# Patient Record
Sex: Female | Born: 1955 | Race: White | Hispanic: No | Marital: Married | Smoking: Never smoker
Health system: Southern US, Community
[De-identification: ages and names within clinical notes are randomized; demographics above are authoritative.]

## PROBLEM LIST (undated history)

## (undated) DIAGNOSIS — M1990 Unspecified osteoarthritis, unspecified site: Secondary | ICD-10-CM

## (undated) DIAGNOSIS — E119 Type 2 diabetes mellitus without complications: Secondary | ICD-10-CM

## (undated) HISTORY — PX: BREAST LUMPECTOMY: SHX2

## (undated) HISTORY — PX: BREAST BIOPSY: SHX20

---

## 2017-01-30 DIAGNOSIS — H59812 Chorioretinal scars after surgery for detachment, left eye: Secondary | ICD-10-CM | POA: Insufficient documentation

## 2017-01-30 DIAGNOSIS — H35411 Lattice degeneration of retina, right eye: Secondary | ICD-10-CM | POA: Insufficient documentation

## 2022-02-05 DIAGNOSIS — E119 Type 2 diabetes mellitus without complications: Secondary | ICD-10-CM | POA: Insufficient documentation

## 2022-02-05 DIAGNOSIS — I10 Essential (primary) hypertension: Secondary | ICD-10-CM | POA: Insufficient documentation

## 2022-03-08 DIAGNOSIS — M545 Low back pain, unspecified: Secondary | ICD-10-CM | POA: Insufficient documentation

## 2022-03-08 DIAGNOSIS — M2569 Stiffness of other specified joint, not elsewhere classified: Secondary | ICD-10-CM | POA: Insufficient documentation

## 2022-03-08 DIAGNOSIS — G8929 Other chronic pain: Secondary | ICD-10-CM | POA: Insufficient documentation

## 2022-04-16 ENCOUNTER — Inpatient Hospital Stay
Admission: RE | Admit: 2022-04-16 | Discharge: 2022-04-16 | Disposition: A | Discharge status: Home / Self Care | Payer: Self-pay | Source: Ambulatory Visit | Attending: *Deleted | Admitting: *Deleted

## 2022-04-16 ENCOUNTER — Other Ambulatory Visit: Payer: Self-pay | Admitting: *Deleted

## 2022-04-16 DIAGNOSIS — Z1231 Encounter for screening mammogram for malignant neoplasm of breast: Secondary | ICD-10-CM

## 2022-04-18 ENCOUNTER — Other Ambulatory Visit: Payer: Self-pay | Admitting: Infectious Diseases

## 2022-04-18 DIAGNOSIS — Z1231 Encounter for screening mammogram for malignant neoplasm of breast: Secondary | ICD-10-CM

## 2022-05-22 ENCOUNTER — Ambulatory Visit
Admission: RE | Admit: 2022-05-22 | Discharge: 2022-05-22 | Disposition: A | Discharge status: Home / Self Care | Payer: Medicare Other | Source: Ambulatory Visit | Attending: Infectious Diseases | Admitting: Infectious Diseases

## 2022-05-22 DIAGNOSIS — Z1231 Encounter for screening mammogram for malignant neoplasm of breast: Secondary | ICD-10-CM | POA: Diagnosis present

## 2022-08-14 ENCOUNTER — Ambulatory Visit (INDEPENDENT_AMBULATORY_CARE_PROVIDER_SITE_OTHER): Payer: Medicare Other

## 2022-08-14 ENCOUNTER — Ambulatory Visit: Payer: Medicare Other | Admitting: Podiatry

## 2022-08-14 DIAGNOSIS — M7661 Achilles tendinitis, right leg: Secondary | ICD-10-CM | POA: Diagnosis not present

## 2022-08-14 DIAGNOSIS — M79671 Pain in right foot: Secondary | ICD-10-CM

## 2022-08-21 ENCOUNTER — Encounter: Payer: Self-pay | Admitting: Podiatry

## 2022-08-21 NOTE — Progress Notes (Signed)
Subjective:  Patient ID: Kayla Mcconnell, female    DOB: 01-29-1956,  MRN: [id number]  Chief Complaint  Patient presents with   Foot Pain    Right heel pain    66 y.o. female presents with the above complaint.  Patient presents with complaint of right Achilles insertional pain.  Patient has been on for quite some time is progressive gotten worse.  She states she was doing in the past with cam boot immobilization has not helped.  She would like to discuss other treatment options that are available.  She denies seeing anyone else prior to seeing me she denies any other acute complaints.  Pain scale 7 out of 10 hurts with ambulation hurts with pressure   Review of Systems: Negative except as noted in the HPI. Denies N/V/F/Ch.  No past medical history on file.  Current Outpatient Medications:    atorvastatin (LIPITOR) 10 MG tablet, Take by mouth., Disp: , Rfl:    empagliflozin (JARDIANCE) 10 MG TABS tablet, Take 1 tablet by mouth daily., Disp: , Rfl:    glipiZIDE (GLUCOTROL) 5 MG tablet, Take by mouth., Disp: , Rfl:    glyBURIDE (DIABETA) 5 MG tablet, , Disp: , Rfl:    metFORMIN (GLUCOPHAGE) 1000 MG tablet, , Disp: , Rfl:    ondansetron (ZOFRAN-ODT) 4 MG disintegrating tablet, Take by mouth., Disp: , Rfl:    oxybutynin (DITROPAN-XL) 5 MG 24 hr tablet, Take by mouth., Disp: , Rfl:    amoxicillin-clavulanate (AUGMENTIN) 500-125 MG tablet, SMARTSIG:1 Tablet(s) By Mouth Every 12 Hours, Disp: , Rfl:    estradiol (ESTRACE) 0.1 MG/GM vaginal cream, Place 1 g vaginally at bedtime., Disp: , Rfl:    fluorouracil (EFUDEX) 5 % cream, Apply topically 2 (two) times daily., Disp: , Rfl:    lisinopril (ZESTRIL) 5 MG tablet, Take by mouth., Disp: , Rfl:    methylPREDNISolone (MEDROL DOSEPAK) 4 MG TBPK tablet, Take by mouth as directed., Disp: , Rfl:    MYRBETRIQ 25 MG TB24 tablet, Take 25 mg by mouth daily., Disp: , Rfl:    nitrofurantoin, macrocrystal-monohydrate, (MACROBID) 100 MG capsule, SMARTSIG:1  Capsule(s) By Mouth, Disp: , Rfl:    phenazopyridine (PYRIDIUM) 95 MG tablet, Take by mouth., Disp: , Rfl:    prednisoLONE acetate (PRED FORTE) 1 % ophthalmic suspension, Apply to eye., Disp: , Rfl:    predniSONE (DELTASONE) 50 MG tablet, Take 50 mg by mouth every 3 (three) hours., Disp: , Rfl:   Social History   Tobacco Use  Smoking Status Not on file  Smokeless Tobacco Not on file    Not on File Objective:  There were no vitals filed for this visit. There is no height or weight on file to calculate BMI. Constitutional Well developed. Well nourished.  Vascular Dorsalis pedis pulses palpable bilaterally. Posterior tibial pulses palpable bilaterally. Capillary refill normal to all digits.  No cyanosis or clubbing noted. Pedal hair growth normal.  Neurologic Normal speech. Oriented to person, place, and time. Epicritic sensation to light touch grossly present bilaterally.  Dermatologic Nails well groomed and normal in appearance. No open wounds. No skin lesions.  Orthopedic: Pain on palpation of right Achilles insertional pain.  Pain with dorsiflexion of the ankle joint no pain with plantarflexion of the ankle joint.  No pain of the peroneal tendon, posterior tibial tendon, ATFL ligament.  No deep intra-articular ankle pain noted.  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 Assessment:   1. Achilles tendinitis, right leg    Plan:  Patient was evaluated and treated and all questions answered.  Right Achilles tendinitis with underlying Haglund's deformity and gastrocnemius equinus -All questions and concerns were discussed with the patient in extensive detail. -Given the amount of pain that she is experiencing  in the setting of failed cam boot immobilization I believe she will benefit from a steroid injection as well as heel lift.  I discussed this with the patient in extensive detail if there is no improvement we will plan on getting an MRI during next clinical visit. -A steroid injection was performed at Right Kager's fat pad using 1% plain Lidocaine and 10 mg of Kenalog. This was well tolerated. -Heel lifts were dispensed  No follow-ups on file.

## 2022-09-27 ENCOUNTER — Ambulatory Visit: Payer: Medicare Other | Admitting: Podiatry

## 2022-10-25 ENCOUNTER — Encounter: Payer: Self-pay | Admitting: Ophthalmology

## 2022-10-27 IMAGING — MG MM DIGITAL SCREENING BILAT W/ TOMO AND CAD
6 of 10 series · 6 of 30 positions shown · non-contrast
Comparison: Previous exam(s).

CLINICAL DATA: Screening.

EXAM:
DIGITAL SCREENING BILATERAL MAMMOGRAM WITH TOMOSYNTHESIS AND CAD
TECHNIQUE: Bilateral screening digital craniocaudal and mediolateral oblique
mammograms were obtained. Bilateral screening digital breast
tomosynthesis was performed. The images were evaluated with
computer-aided detection.

[R MLO synth-2D]
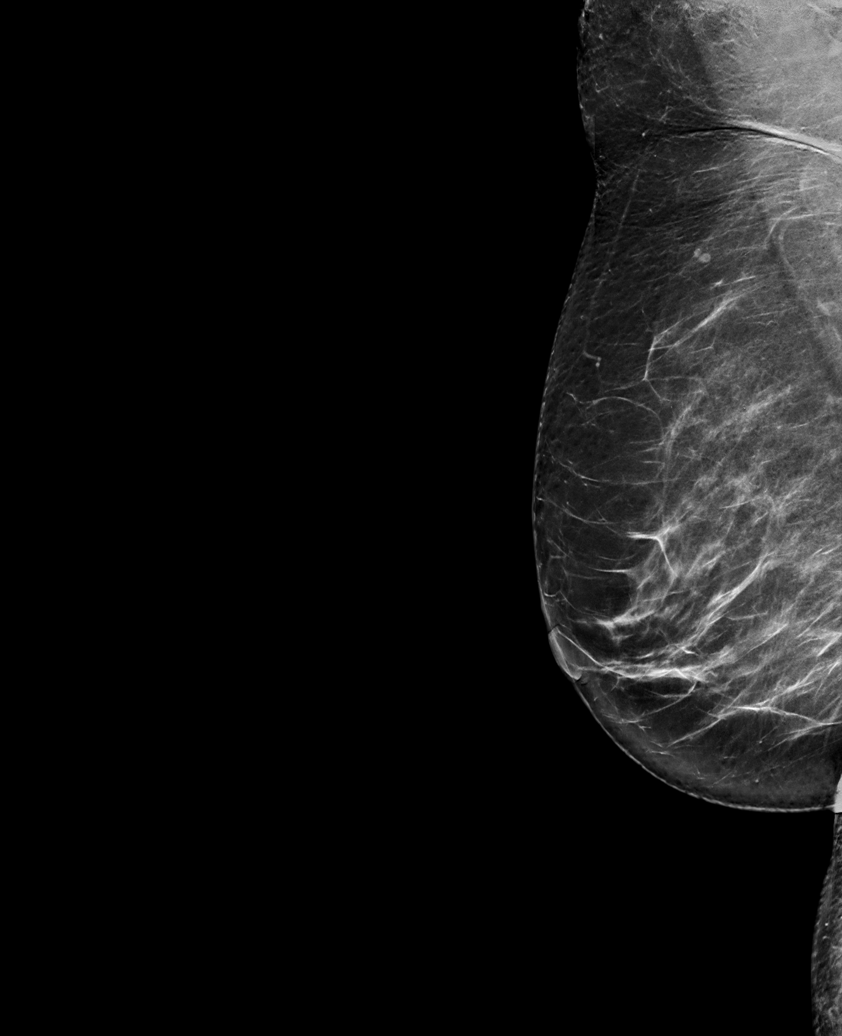

[L XCCL synth-2D]
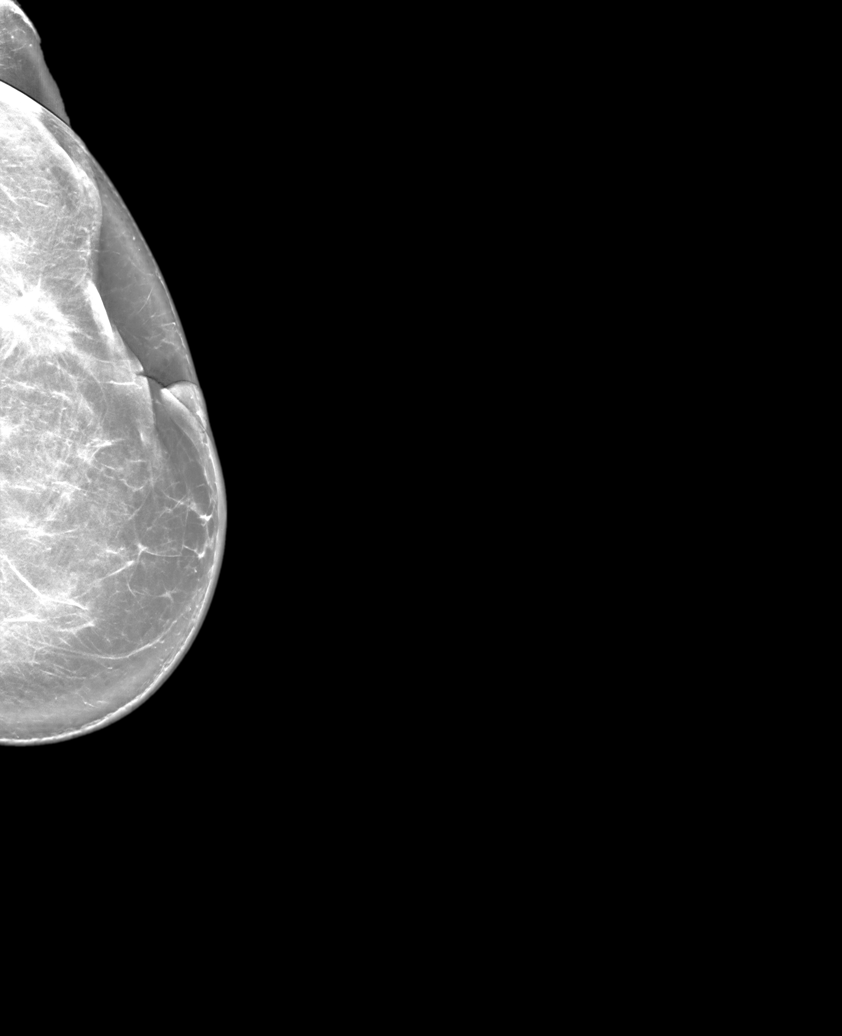

[L CC synth-2D]
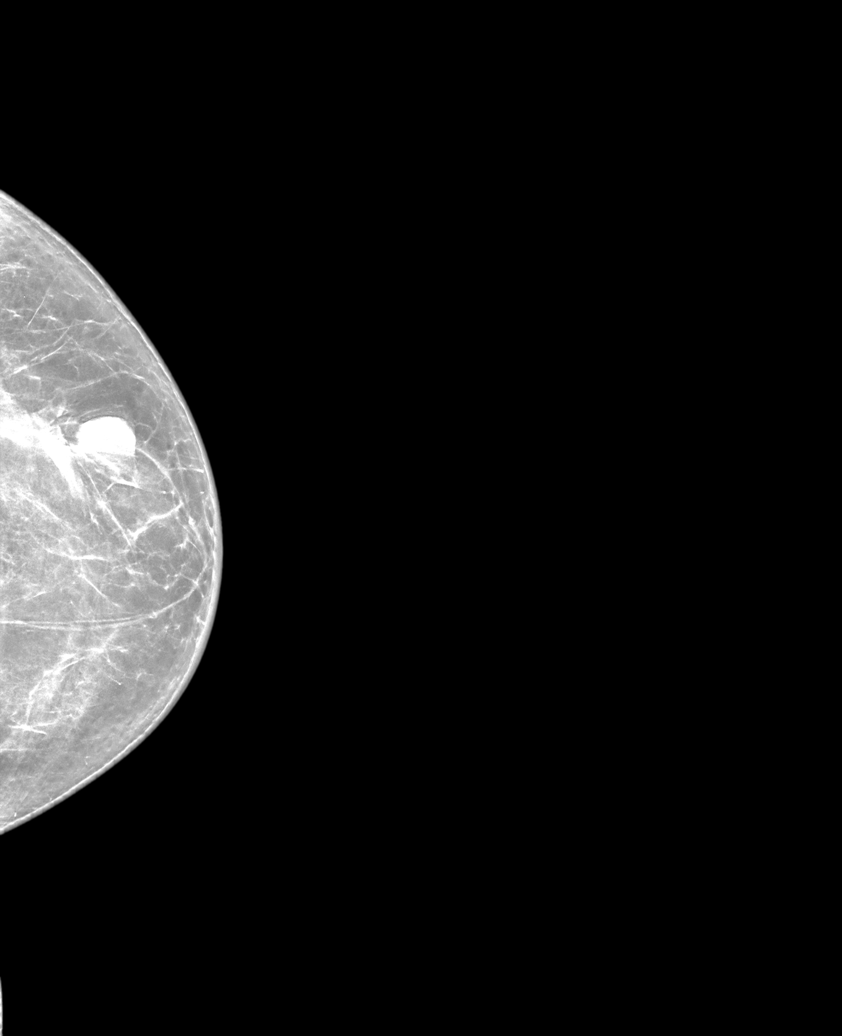

[R CC synth-2D]
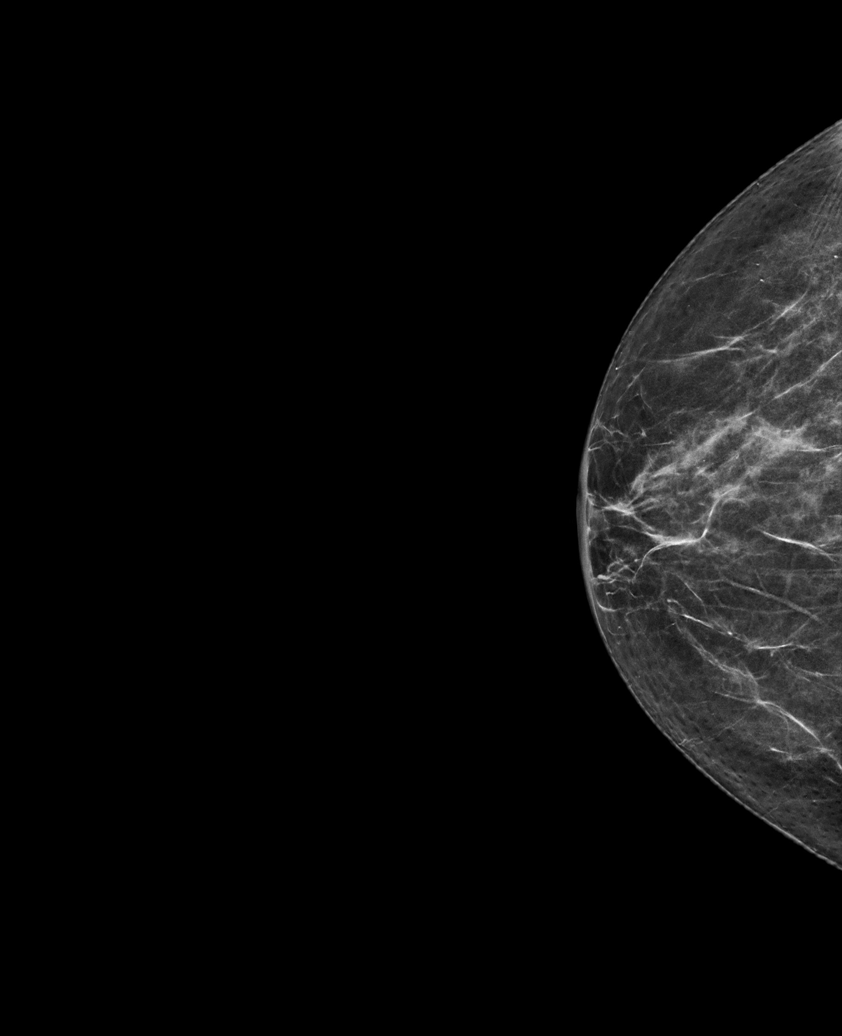

[L MLO synth-2D]
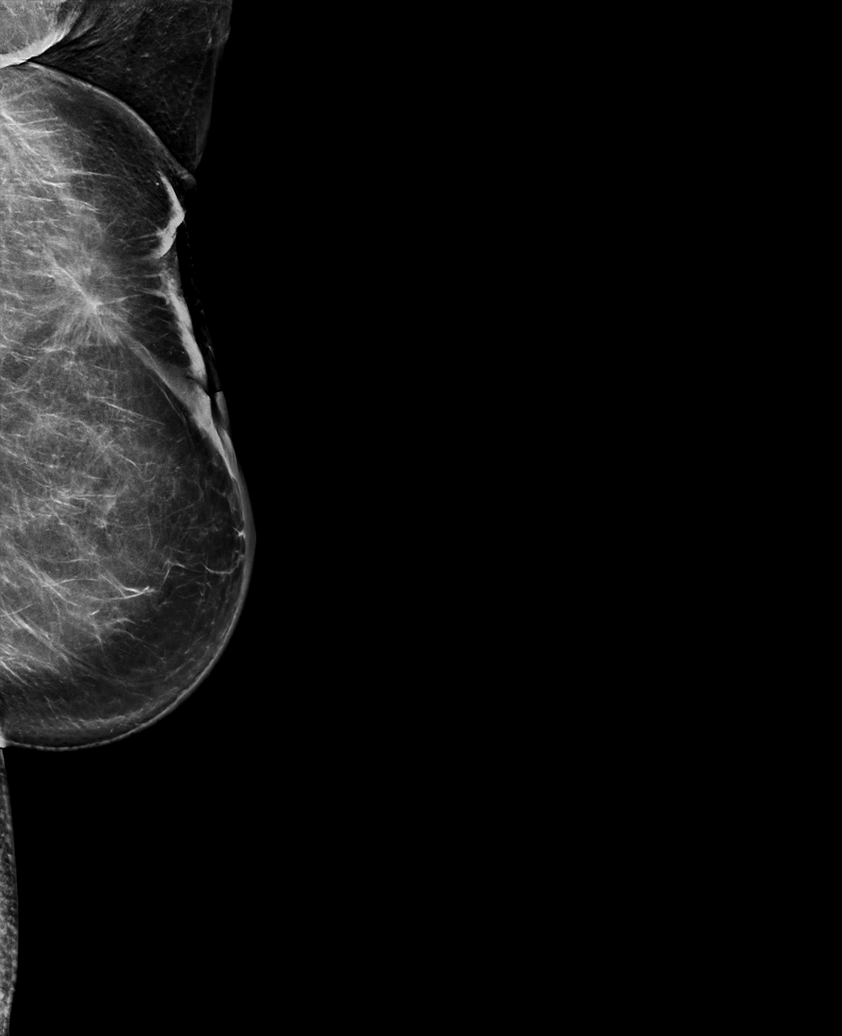

[R CC tomo · tomo slice 33/65.0]
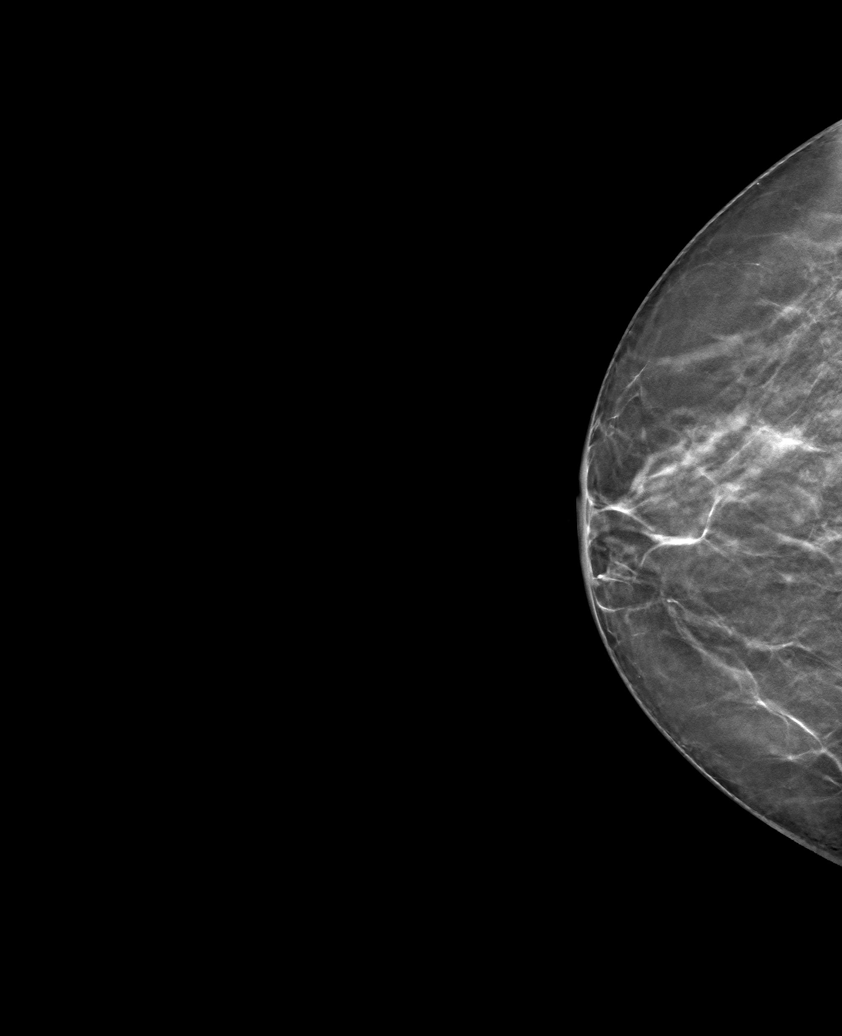

[6 of 30 positions shown; findings below may reference images not displayed]

ACR Breast Density Category b: There are scattered areas of
fibroglandular density.
FINDINGS: There are no findings suspicious for malignancy.
IMPRESSION: No mammographic evidence of malignancy. A result letter of this
screening mammogram will be mailed directly to the patient.

RECOMMENDATION:
Screening mammogram in one year. (Code:51-O-LD2)

BI-RADS CATEGORY  1: Negative.

## 2022-10-30 NOTE — Discharge Instructions (Signed)

## 2022-10-31 ENCOUNTER — Ambulatory Visit
Admission: RE | Admit: 2022-10-31 | Discharge: 2022-10-31 | Disposition: A | Discharge status: Home / Self Care | Payer: Medicare Other | Attending: Ophthalmology | Admitting: Ophthalmology

## 2022-10-31 ENCOUNTER — Encounter
Admission: RE | Disposition: A | Discharge status: Home / Self Care | Payer: Self-pay | Source: Home / Self Care | Attending: Ophthalmology

## 2022-10-31 ENCOUNTER — Encounter: Payer: Self-pay | Admitting: Ophthalmology

## 2022-10-31 ENCOUNTER — Ambulatory Visit: Payer: Medicare Other | Admitting: Anesthesiology

## 2022-10-31 ENCOUNTER — Other Ambulatory Visit: Payer: Self-pay

## 2022-10-31 DIAGNOSIS — H2511 Age-related nuclear cataract, right eye: Secondary | ICD-10-CM | POA: Diagnosis not present

## 2022-10-31 DIAGNOSIS — E1136 Type 2 diabetes mellitus with diabetic cataract: Secondary | ICD-10-CM | POA: Insufficient documentation

## 2022-10-31 HISTORY — DX: Type 2 diabetes mellitus without complications: E11.9

## 2022-10-31 HISTORY — PX: CATARACT EXTRACTION W/PHACO: SHX586

## 2022-10-31 HISTORY — DX: Unspecified osteoarthritis, unspecified site: M19.90

## 2022-10-31 LAB — GLUCOSE, CAPILLARY: Glucose-Capillary: 119 mg/dL — ABNORMAL HIGH (ref 70–99)

## 2022-10-31 SURGERY — PHACOEMULSIFICATION, CATARACT, WITH IOL INSERTION
Anesthesia: Monitor Anesthesia Care | Site: Eye | Laterality: Right

## 2022-10-31 MED ORDER — FENTANYL CITRATE (PF) 100 MCG/2ML IJ SOLN
INTRAMUSCULAR | Status: DC | PRN
  Administered 2022-10-31: 50 ug via INTRAVENOUS

## 2022-10-31 MED ORDER — SIGHTPATH DOSE#1 BSS IO SOLN
INTRAOCULAR | Status: DC | PRN
  Administered 2022-10-31: 15 mL

## 2022-10-31 MED ORDER — SIGHTPATH DOSE#1 BSS IO SOLN
INTRAOCULAR | Status: DC | PRN
  Administered 2022-10-31: 73 mL via OPHTHALMIC

## 2022-10-31 MED ORDER — SIGHTPATH DOSE#1 NA HYALUR & NA CHOND-NA HYALUR IO KIT
PACK | INTRAOCULAR | Status: DC | PRN
  Administered 2022-10-31: 1 via OPHTHALMIC

## 2022-10-31 MED ORDER — MOXIFLOXACIN HCL 0.5 % OP SOLN: OPHTHALMIC | Status: DC | PRN

## 2022-10-31 MED ORDER — TETRACAINE HCL 0.5 % OP SOLN
1.0000 [drp] | OPHTHALMIC | Status: DC | PRN
  Administered 2022-10-31 (×3): 1 [drp] via OPHTHALMIC

## 2022-10-31 MED ORDER — CEFUROXIME OPHTHALMIC INJECTION 1 MG/0.1 ML
INJECTION | OPHTHALMIC | Status: DC | PRN
  Administered 2022-10-31: 1 mg via OPHTHALMIC

## 2022-10-31 MED ORDER — ARMC OPHTHALMIC DILATING DROPS
1.0000 | OPHTHALMIC | Status: DC | PRN
  Administered 2022-10-31 (×3): 1 via OPHTHALMIC

## 2022-10-31 MED ORDER — BRIMONIDINE TARTRATE-TIMOLOL 0.2-0.5 % OP SOLN
OPHTHALMIC | Status: DC | PRN
  Administered 2022-10-31: 1 [drp] via OPHTHALMIC

## 2022-10-31 MED ORDER — SIGHTPATH DOSE#1 BSS IO SOLN
INTRAOCULAR | Status: DC | PRN
  Administered 2022-10-31: 1 mL via INTRAMUSCULAR

## 2022-10-31 MED ORDER — MIDAZOLAM HCL 2 MG/2ML IJ SOLN
INTRAMUSCULAR | Status: DC | PRN
  Administered 2022-10-31: 2 mg via INTRAVENOUS

## 2022-10-31 SURGICAL SUPPLY — 20 items
CANNULA ANT/CHMB 27G (MISCELLANEOUS) IMPLANT
CANNULA ANT/CHMB 27GA (MISCELLANEOUS) IMPLANT
CATARACT SUITE SIGHTPATH (MISCELLANEOUS) ×1 IMPLANT
FEE CATARACT SUITE SIGHTPATH (MISCELLANEOUS) ×1 IMPLANT
GLOVE SRG 8 PF TXTR STRL LF DI (GLOVE) ×1 IMPLANT
GLOVE SURG ENC TEXT LTX SZ7.5 (GLOVE) ×1 IMPLANT
GLOVE SURG GAMMEX PI TX LF 7.5 (GLOVE) IMPLANT
GLOVE SURG UNDER POLY LF SZ8 (GLOVE) ×1
LENS IOL TECNIS EYHANCE 19.0 (Intraocular Lens) IMPLANT
NDL FILTER BLUNT 18X1 1/2 (NEEDLE) ×1 IMPLANT
NDL RETROBULBAR .5 NSTRL (NEEDLE) IMPLANT
NEEDLE FILTER BLUNT 18X1 1/2 (NEEDLE) ×1 IMPLANT
PACK VIT ANT 23G (MISCELLANEOUS) IMPLANT
RING MALYGIN 7.0 (MISCELLANEOUS) IMPLANT
SUT ETHILON 10-0 CS-B-6CS-B-6 (SUTURE)
SUT VICRYL  9 0 (SUTURE)
SUT VICRYL 9 0 (SUTURE) IMPLANT
SUTURE EHLN 10-0 CS-B-6CS-B-6 (SUTURE) IMPLANT
SYR 3ML LL SCALE MARK (SYRINGE) ×1 IMPLANT
WATER STERILE IRR 250ML POUR (IV SOLUTION) ×1 IMPLANT

## 2022-10-31 NOTE — Transfer of Care (Signed)
Immediate Anesthesia Transfer of Care Note  Patient: Kayla Mcconnell  Procedure(s) Performed: CATARACT EXTRACTION PHACO AND INTRAOCULAR LENS PLACEMENT (IOC) RIGHT DIABETIC (Right: Eye)  Patient Location: PACU  Anesthesia Type: MAC  Level of Consciousness: awake, alert  and patient cooperative  Airway and Oxygen Therapy: Patient Spontanous Breathing and Patient connected to supplemental oxygen  Post-op Assessment: Post-op Vital signs reviewed, Patient's Cardiovascular Status Stable, Respiratory Function Stable, Patent Airway and No signs of Nausea or vomiting  Post-op Vital Signs: Reviewed and stable  Complications: No notable events documented.

## 2022-10-31 NOTE — Op Note (Signed)
LOCATION:  [facility]   PREOPERATIVE DIAGNOSIS:    Nuclear sclerotic cataract right eye. H25.11   POSTOPERATIVE DIAGNOSIS:  Nuclear sclerotic cataract right eye.     PROCEDURE:  Phacoemusification with posterior chamber intraocular lens placement of the right eye   ULTRASOUND TIME: Procedure(s) with comments: CATARACT EXTRACTION PHACO AND INTRAOCULAR LENS PLACEMENT (IOC) RIGHT DIABETIC (Right) - Diabetic 10.53 01:22.5  LENS:   Implant Name Type Inv. Item Serial No. Manufacturer Lot No. LRB No. Used Action  LENS IOL TECNIS EYHANCE 19.0 - [id number] Intraocular Lens LENS IOL TECNIS EYHANCE 19.0 [id number] [employer]  Right 1 Implanted         SURGEON:  Deirdre Evener, MD   ANESTHESIA:  Topical with tetracaine drops and 2% Xylocaine jelly, augmented with 1% preservative-free intracameral lidocaine.    COMPLICATIONS:  None.   DESCRIPTION OF PROCEDURE:  The patient was identified in the holding room and transported to the operating room and placed in the supine position under the operating microscope.  The right eye was identified as the operative eye and it was prepped and draped in the usual sterile ophthalmic fashion.   A 1 millimeter clear-corneal paracentesis was made at the 12:00 position.  0.5 ml of preservative-free 1% lidocaine was injected into the anterior chamber. The anterior chamber was filled with Viscoat viscoelastic.  A 2.4 millimeter keratome was used to make a near-clear corneal incision at the 9:00 position.  A curvilinear capsulorrhexis was made with a cystotome and capsulorrhexis forceps.  Balanced salt solution was used to hydrodissect and hydrodelineate the nucleus.   Phacoemulsification was then used in stop and chop fashion to remove the lens nucleus and epinucleus.  The remaining cortex was then removed using the irrigation and aspiration handpiece. Provisc was then placed into the capsular bag to distend it for lens placement.  A lens was then  injected into the capsular bag.  The remaining viscoelastic was aspirated.   Wounds were hydrated with balanced salt solution.  The anterior chamber was inflated to a physiologic pressure with balanced salt solution.  No wound leaks were noted. Cefuroxime 0.1 ml of a 10mg /ml solution was injected into the anterior chamber for a dose of 1 mg of intracameral antibiotic at the completion of the case.   Timolol and Brimonidine drops were applied to the eye.  The patient was taken to the recovery room in stable condition without complications of anesthesia or surgery.   Donnita Farina 10/31/2022, 7:54 AM

## 2022-10-31 NOTE — H&P (Signed)
  [facility]   Primary Care Physician:  Mick Sell, MD Ophthalmologist: Dr. Lockie Mola  Pre-Procedure History & Physical: HPI:  Kayla Mcconnell is a 66 y.o. female here for ophthalmic surgery.   Past Medical History:  Diagnosis Date   Arthritis    lower back, knees   Type 2 diabetes mellitus (HCC)     Past Surgical History:  Procedure Laterality Date   BREAST BIOPSY     BREAST LUMPECTOMY      Prior to Admission medications   Medication Sig Start Date End Date Taking? Authorizing Provider  fluorouracil (EFUDEX) 5 % cream Apply topically 2 (two) times daily.   Yes [provider]  glipiZIDE (GLUCOTROL) 5 MG tablet Take by mouth. 06/01/20  Yes [provider]  metFORMIN (GLUCOPHAGE) 1000 MG tablet  06/01/20  Yes [provider]  MYRBETRIQ 25 MG TB24 tablet Take 25 mg by mouth daily. 06/01/22  Yes [provider]  nitrofurantoin, macrocrystal-monohydrate, (MACROBID) 100 MG capsule SMARTSIG:1 Capsule(s) By Mouth 08/03/22  Yes [provider]  prednisoLONE acetate (PRED FORTE) 1 % ophthalmic suspension Apply to eye.   Yes [provider]    Allergies as of 09/28/2022   (Not on File)    History reviewed. No pertinent family history.  Social History   Socioeconomic History   Marital status: Married    Spouse name: Not on file   Number of children: Not on file   Years of education: Not on file   Highest education level: Not on file  Occupational History   Not on file  Tobacco Use   Smoking status: Never   Smokeless tobacco: Never  Vaping Use   Vaping Use: Never used  Substance and Sexual Activity   Alcohol use: Yes    Comment: occasional   Drug use: Not on file   Sexual activity: Not on file  Other Topics Concern   Not on file  Social History Narrative   Not on file   Social Determinants of Health   Financial Resource Strain: Not on file  Food Insecurity: Not on file  Transportation  Needs: Not on file  Physical Activity: Not on file  Stress: Not on file  Social Connections: Not on file  Intimate Partner Violence: Not on file    Review of Systems: See HPI, otherwise negative ROS  Physical Exam: BP 138/77   Pulse 70   Temp 98 F (36.7 C) (Temporal)   Ht 5\' 9"  (1.753 m)   Wt 73.9 kg   SpO2 96%   BMI 24.07 kg/m  General:   Alert,  pleasant and cooperative in NAD Head:  Normocephalic and atraumatic. Lungs:  Clear to auscultation.    Heart:  Regular rate and rhythm.   Impression/Plan: Kayla Mcconnell is here for ophthalmic surgery.  Risks, benefits, limitations, and alternatives regarding ophthalmic surgery have been reviewed with the patient.  Questions have been answered.  All parties agreeable.   Lazarus Gowda, MD  10/31/2022, 7:29 AM

## 2022-10-31 NOTE — Anesthesia Postprocedure Evaluation (Signed)
Anesthesia Post Note  Patient: Rabab Currington  Procedure(s) Performed: CATARACT EXTRACTION PHACO AND INTRAOCULAR LENS PLACEMENT (IOC) RIGHT DIABETIC (Right: Eye)  Patient location during evaluation: PACU Anesthesia Type: MAC Level of consciousness: awake and alert Pain management: pain level controlled Vital Signs Assessment: post-procedure vital signs reviewed and stable Respiratory status: spontaneous breathing, nonlabored ventilation, respiratory function stable and patient connected to nasal cannula oxygen Cardiovascular status: stable and blood pressure returned to baseline Postop Assessment: no apparent nausea or vomiting Anesthetic complications: no   No notable events documented.   Last Vitals:  Vitals:   10/31/22 0756 10/31/22 0802  BP: 129/83 132/83  Pulse: 78 68  Resp: 18 16  Temp: (!) 36.4 C 36.6 C  SpO2: 95% 93%    Last Pain:  Vitals:   10/31/22 0756  TempSrc:   PainSc: 0-No pain                 Martha Clan

## 2022-10-31 NOTE — Anesthesia Preprocedure Evaluation (Signed)
Anesthesia Evaluation  Patient identified by MRN, date of birth, ID band Patient awake    Reviewed: Allergy & Precautions, H&P , NPO status , Patient's Chart, lab work & pertinent test results, reviewed documented beta blocker date and time   History of Anesthesia Complications Negative for: history of anesthetic complications  Airway Mallampati: III  TM Distance: >3 FB Neck ROM: full    Dental  (+) Dental Advidsory Given, Teeth Intact   Pulmonary neg pulmonary ROS   Pulmonary exam normal breath sounds clear to auscultation       Cardiovascular Exercise Tolerance: Good negative cardio ROS Normal cardiovascular exam Rhythm:regular Rate:Normal     Neuro/Psych negative neurological ROS  negative psych ROS   GI/Hepatic negative GI ROS, Neg liver ROS,,,  Endo/Other  diabetes    Renal/GU negative Renal ROS  negative genitourinary   Musculoskeletal   Abdominal   Peds  Hematology negative hematology ROS (+)   Anesthesia Other Findings Past Medical History: No date: Arthritis     Comment:  lower back, knees No date: Type 2 diabetes mellitus (HCC)   Reproductive/Obstetrics negative OB ROS                             Anesthesia Physical Anesthesia Plan  ASA: 2  Anesthesia Plan: MAC   Post-op Pain Management:    Induction: Intravenous  PONV Risk Score and Plan: 2 and Midazolam and Treatment may vary due to age or medical condition  Airway Management Planned: Natural Airway and Nasal Cannula  Additional Equipment:   Intra-op Plan:   Post-operative Plan:   Informed Consent: I have reviewed the patients History and Physical, chart, labs and discussed the procedure including the risks, benefits and alternatives for the proposed anesthesia with the patient or authorized representative who has indicated his/her understanding and acceptance.     Dental Advisory Given  Plan Discussed  with: Anesthesiologist, CRNA and Surgeon  Anesthesia Plan Comments:        Anesthesia Quick Evaluation

## 2023-05-28 ENCOUNTER — Other Ambulatory Visit: Payer: Self-pay | Admitting: Infectious Diseases

## 2023-05-28 DIAGNOSIS — R7989 Other specified abnormal findings of blood chemistry: Secondary | ICD-10-CM

## 2023-06-04 ENCOUNTER — Ambulatory Visit: Payer: Medicare Other | Attending: Infectious Diseases

## 2023-06-17 ENCOUNTER — Ambulatory Visit: Payer: Medicare Other | Admitting: Urology

## 2023-06-21 ENCOUNTER — Encounter: Payer: Self-pay | Admitting: Urology

## 2024-02-27 ENCOUNTER — Other Ambulatory Visit (INDEPENDENT_AMBULATORY_CARE_PROVIDER_SITE_OTHER): Payer: Self-pay | Admitting: Nurse Practitioner

## 2024-02-27 DIAGNOSIS — R42 Dizziness and giddiness: Secondary | ICD-10-CM

## 2024-02-27 DIAGNOSIS — I6529 Occlusion and stenosis of unspecified carotid artery: Secondary | ICD-10-CM

## 2024-02-28 ENCOUNTER — Ambulatory Visit (INDEPENDENT_AMBULATORY_CARE_PROVIDER_SITE_OTHER): Payer: Medicare Other

## 2024-02-28 ENCOUNTER — Ambulatory Visit (INDEPENDENT_AMBULATORY_CARE_PROVIDER_SITE_OTHER): Payer: Medicare Other | Admitting: Nurse Practitioner

## 2024-02-28 ENCOUNTER — Encounter (INDEPENDENT_AMBULATORY_CARE_PROVIDER_SITE_OTHER): Payer: Self-pay | Admitting: Nurse Practitioner

## 2024-02-28 VITALS — BP 133/85 | HR 88 | Resp 18 | Ht 70.0 in | Wt 165.4 lb

## 2024-02-28 DIAGNOSIS — R42 Dizziness and giddiness: Secondary | ICD-10-CM

## 2024-02-28 DIAGNOSIS — I6529 Occlusion and stenosis of unspecified carotid artery: Secondary | ICD-10-CM | POA: Diagnosis not present

## 2024-02-28 DIAGNOSIS — E119 Type 2 diabetes mellitus without complications: Secondary | ICD-10-CM

## 2024-02-29 NOTE — Progress Notes (Incomplete)
 Subjective:    Patient ID: Hitomi Slape, female    DOB: 1956/03/30, 68 y.o.   MRN: [id number] Chief Complaint  Patient presents with  . New Patient (Initial Visit)    NP. carotid/consult. dizziness. carotid stenosis. fitzgerald    HPI  Review of Systems     Objective:   Physical Exam  BP 133/85   Pulse 88   Resp 18   Ht 5\' 10"  (1.778 m)   Wt 165 lb 6.4 oz (75 kg)   BMI 23.73 kg/m   Past Medical History:  Diagnosis Date  . Arthritis    lower back, knees  . Type 2 diabetes mellitus (HCC)     Social History   Socioeconomic History  . Marital status: Married    Spouse name: Not on file  . Number of children: Not on file  . Years of education: Not on file  . Highest education level: Not on file  Occupational History  . Not on file  Tobacco Use  . Smoking status: Never  . Smokeless tobacco: Never  Vaping Use  . Vaping status: Never Used  Substance and Sexual Activity  . Alcohol use: Yes    Comment: occasional  . Drug use: Not on file  . Sexual activity: Not on file  Other Topics Concern  . Not on file  Social History Narrative  . Not on file   Social Drivers of Health   Financial Resource Strain: Low Risk  (01/08/2024)   Received from [facility]   Overall Financial Resource Strain (CARDIA)   . Difficulty of Paying Living Expenses: Not hard at all  Food Insecurity: No Food Insecurity (01/08/2024)   Received from [facility]   Hunger Vital Sign   . Worried About Programme researcher, broadcasting/film/video in the Last Year: Never true   . Ran Out of Food in the Last Year: Never true  Transportation Needs: No Transportation Needs (01/08/2024)   Received from [facility]   [facility] - Transportation   . In the past 12 months, has lack of transportation kept you from medical appointments or from getting medications?: No   . Lack of Transportation (Non-Medical): No  Physical Activity: Not on file  Stress: Not on file   Social Connections: Not on file  Intimate Partner Violence: Not on file    Past Surgical History:  Procedure Laterality Date  . BREAST BIOPSY    . BREAST LUMPECTOMY    . CATARACT EXTRACTION W/PHACO Right 10/31/2022   Procedure: CATARACT EXTRACTION PHACO AND INTRAOCULAR LENS PLACEMENT (IOC) RIGHT DIABETIC;  Surgeon: Lockie Mola, MD;  Location: [facility];  Service: Ophthalmology;  Laterality: Right;  Diabetic 10.53 01:22.5    History reviewed. No pertinent family history.  No Known Allergies      No data to display            CMP  No results found for: "NA", "K", "CL", "CO2", "GLUCOSE", "BUN", "CREATININE", "CALCIUM", "PROT", "ALBUMIN", "AST", "ALT", "ALKPHOS", "BILITOT", "GFR", "EGFR", "GFRNONAA"   No results found.     Assessment & Plan:   1. Dizziness (Primary) Recommend:  Given the patient's asymptomatic subcritical stenosis no further invasive testing or surgery at this time.  Duplex ultrasound shows no significant carotid disease as well as no evidence of subclavian steal symptoms.  Based on this patient will follow-up with [place] on an as-needed basis.  2. Type 2 diabetes mellitus without complication, without long-term current use of insulin (  HCC) Continue hypoglycemic medications as already ordered, these medications have been reviewed and there are no changes at this time.  Hgb A1C to be monitored as already arranged by primary service   Current Outpatient Medications on File Prior to Visit  Medication Sig Dispense Refill  . fluorouracil (EFUDEX) 5 % cream Apply topically 2 (two) times daily.    Marland Kitchen glipiZIDE (GLUCOTROL) 5 MG tablet Take by mouth.    . metFORMIN (GLUCOPHAGE) 1000 MG tablet     . MYRBETRIQ 25 MG TB24 tablet Take 25 mg by mouth daily. (Patient not taking: Reported on 02/28/2024)    . nitrofurantoin, macrocrystal-monohydrate, (MACROBID) 100 MG capsule SMARTSIG:1 Capsule(s) By Mouth    . prednisoLONE acetate (PRED FORTE) 1 %  ophthalmic suspension Apply to eye.     No current facility-administered medications on file prior to visit.    There are no Patient Instructions on file for this visit. No follow-ups on file.   Georgiana Spinner, NP

## 2024-02-29 NOTE — Progress Notes (Signed)
 Subjective:    Patient ID: Kayla Mcconnell, female    DOB: [birth year], 68 y.o.   MRN: [id number] Chief Complaint  Patient presents with   New Patient (Initial Visit)    NP. carotid/consult. dizziness. carotid stenosis. fitzgerald    The patient is a 68 year old female who was referred by her primary care provider for dizziness.  She notes that she has some occasional dizziness when going from laying to sitting positions.  Additionally sometimes it happens when rolling over.  It is not consistent day-to-day.  She denies any TIA or amaurosis fugax.  She has had no previous CVA.  Today noninvasive study showed no evidence of significant carotid disease.  Extracranial vessels were noted to be near normal with only minimal wall thickening and/or plaque today.  Her bilateral vertebral arteries have antegrade flow with normal flow hemodynamics seen in the bilateral subclavian arteries.  No evidence of subclavian steal noted.    Review of Systems  Neurological:  Positive for dizziness.  All other systems reviewed and are negative.      Objective:   Physical Exam Vitals reviewed.  HENT:     Head: Normocephalic.  Cardiovascular:     Rate and Rhythm: Normal rate.     Pulses: Normal pulses.  Pulmonary:     Effort: Pulmonary effort is normal.  Skin:    General: Skin is warm and dry.  Neurological:     Mental Status: She is alert and oriented to person, place, and time.  Psychiatric:        Mood and Affect: Mood normal.        Behavior: Behavior normal.        Thought Content: Thought content normal.        Judgment: Judgment normal.     BP 133/85   Pulse 88   Resp 18   Ht 5\' 10"  (1.778 m)   Wt 165 lb 6.4 oz (75 kg)   BMI 23.73 kg/m   Past Medical History:  Diagnosis Date   Arthritis    lower back, knees   Type 2 diabetes mellitus (HCC)     Social History   Socioeconomic History   Marital status: Married    Spouse name: Not on file   Number of children: Not on file    Years of education: Not on file   Highest education level: Not on file  Occupational History   Not on file  Tobacco Use   Smoking status: Never   Smokeless tobacco: Never  Vaping Use   Vaping status: Never Used  Substance and Sexual Activity   Alcohol use: Yes    Comment: occasional   Drug use: Not on file   Sexual activity: Not on file  Other Topics Concern   Not on file  Social History Narrative   Not on file   Social Drivers of Health   Financial Resource Strain: Low Risk  (01/08/2024)   Received from [facility]   Overall Financial Resource Strain (CARDIA)    Difficulty of Paying Living Expenses: Not hard at all  Food Insecurity: No Food Insecurity (01/08/2024)   Received from [facility]   Hunger Vital Sign    Worried About Running Out of Food in the Last Year: Never true    Ran Out of Food in the Last Year: Never true  Transportation Needs: No Transportation Needs (01/08/2024)   Received from [facility]   [facility] - Transportation  In the past 12 months, has lack of transportation kept you from medical appointments or from getting medications?: No    Lack of Transportation (Non-Medical): No  Physical Activity: Not on file  Stress: Not on file  Social Connections: Not on file  Intimate Partner Violence: Not on file    Past Surgical History:  Procedure Laterality Date   BREAST BIOPSY     BREAST LUMPECTOMY     CATARACT EXTRACTION W/PHACO Right 10/31/2022   Procedure: CATARACT EXTRACTION PHACO AND INTRAOCULAR LENS PLACEMENT (IOC) RIGHT DIABETIC;  Surgeon: Lockie Mola, MD;  Location: [facility];  Service: Ophthalmology;  Laterality: Right;  Diabetic 10.53 01:22.5    History reviewed. No pertinent family history.  No Known Allergies      No data to display            CMP  No results found for: "NA", "K", "CL", "CO2", "GLUCOSE", "BUN", "CREATININE", "CALCIUM", "PROT", "ALBUMIN",  "AST", "ALT", "ALKPHOS", "BILITOT", "GFR", "EGFR", "GFRNONAA"   No results found.     Assessment & Plan:   1. Dizziness (Primary) Recommend:  Given the patient's asymptomatic subcritical stenosis no further invasive testing or surgery at this time.  Duplex ultrasound shows no significant carotid disease as well as no evidence of subclavian steal symptoms.  Based on this patient will follow-up with [place] on an as-needed basis.  I suspect her dizziness may be inner ear related.  Will refer the patient to ENT for further evaluation and workup.  2. Type 2 diabetes mellitus without complication, without long-term current use of insulin (HCC) Continue hypoglycemic medications as already ordered, these medications have been reviewed and there are no changes at this time.  Hgb A1C to be monitored as already arranged by primary service   Current Outpatient Medications on File Prior to Visit  Medication Sig Dispense Refill   fluorouracil (EFUDEX) 5 % cream Apply topically 2 (two) times daily.     glipiZIDE (GLUCOTROL) 5 MG tablet Take by mouth.     metFORMIN (GLUCOPHAGE) 1000 MG tablet      MYRBETRIQ 25 MG TB24 tablet Take 25 mg by mouth daily. (Patient not taking: Reported on 02/28/2024)     nitrofurantoin, macrocrystal-monohydrate, (MACROBID) 100 MG capsule SMARTSIG:1 Capsule(s) By Mouth     prednisoLONE acetate (PRED FORTE) 1 % ophthalmic suspension Apply to eye.     No current facility-administered medications on file prior to visit.    There are no Patient Instructions on file for this visit. No follow-ups on file.   Georgiana Spinner, NP

## 2024-05-08 ENCOUNTER — Other Ambulatory Visit: Payer: Self-pay | Admitting: Infectious Diseases

## 2024-05-08 DIAGNOSIS — Z1331 Encounter for screening for depression: Secondary | ICD-10-CM

## 2024-05-08 DIAGNOSIS — E119 Type 2 diabetes mellitus without complications: Secondary | ICD-10-CM

## 2024-05-08 DIAGNOSIS — C799 Secondary malignant neoplasm of unspecified site: Secondary | ICD-10-CM

## 2024-05-08 DIAGNOSIS — E782 Mixed hyperlipidemia: Secondary | ICD-10-CM

## 2024-05-13 ENCOUNTER — Ambulatory Visit
Admission: RE | Admit: 2024-05-13 | Discharge: 2024-05-13 | Disposition: A | Discharge status: Home / Self Care | Payer: Self-pay | Source: Ambulatory Visit | Attending: Infectious Diseases | Admitting: Infectious Diseases

## 2024-05-13 DIAGNOSIS — C799 Secondary malignant neoplasm of unspecified site: Secondary | ICD-10-CM | POA: Insufficient documentation

## 2024-05-13 DIAGNOSIS — E782 Mixed hyperlipidemia: Secondary | ICD-10-CM | POA: Insufficient documentation

## 2024-05-13 DIAGNOSIS — Z1331 Encounter for screening for depression: Secondary | ICD-10-CM | POA: Insufficient documentation

## 2024-05-13 DIAGNOSIS — E119 Type 2 diabetes mellitus without complications: Secondary | ICD-10-CM | POA: Insufficient documentation

## 2024-08-28 DIAGNOSIS — E785 Hyperlipidemia, unspecified: Secondary | ICD-10-CM | POA: Diagnosis not present

## 2024-08-28 DIAGNOSIS — Z87440 Personal history of urinary (tract) infections: Secondary | ICD-10-CM | POA: Diagnosis not present

## 2024-08-28 DIAGNOSIS — E1165 Type 2 diabetes mellitus with hyperglycemia: Secondary | ICD-10-CM | POA: Diagnosis not present

## 2024-09-04 DIAGNOSIS — D0462 Carcinoma in situ of skin of left upper limb, including shoulder: Secondary | ICD-10-CM | POA: Diagnosis not present

## 2024-09-04 DIAGNOSIS — D225 Melanocytic nevi of trunk: Secondary | ICD-10-CM | POA: Diagnosis not present

## 2024-09-04 DIAGNOSIS — C44612 Basal cell carcinoma of skin of right upper limb, including shoulder: Secondary | ICD-10-CM | POA: Diagnosis not present

## 2024-09-04 DIAGNOSIS — D2262 Melanocytic nevi of left upper limb, including shoulder: Secondary | ICD-10-CM | POA: Diagnosis not present

## 2024-09-04 DIAGNOSIS — D2271 Melanocytic nevi of right lower limb, including hip: Secondary | ICD-10-CM | POA: Diagnosis not present

## 2024-09-04 DIAGNOSIS — D2272 Melanocytic nevi of left lower limb, including hip: Secondary | ICD-10-CM | POA: Diagnosis not present

## 2024-09-04 DIAGNOSIS — L821 Other seborrheic keratosis: Secondary | ICD-10-CM | POA: Diagnosis not present

## 2024-09-04 DIAGNOSIS — C44712 Basal cell carcinoma of skin of right lower limb, including hip: Secondary | ICD-10-CM | POA: Diagnosis not present

## 2024-09-04 DIAGNOSIS — D2261 Melanocytic nevi of right upper limb, including shoulder: Secondary | ICD-10-CM | POA: Diagnosis not present

## 2024-09-04 DIAGNOSIS — D485 Neoplasm of uncertain behavior of skin: Secondary | ICD-10-CM | POA: Diagnosis not present

## 2024-10-21 DIAGNOSIS — C44712 Basal cell carcinoma of skin of right lower limb, including hip: Secondary | ICD-10-CM | POA: Diagnosis not present

## 2024-10-21 DIAGNOSIS — D0462 Carcinoma in situ of skin of left upper limb, including shoulder: Secondary | ICD-10-CM | POA: Diagnosis not present
